# Patient Record
Sex: Male | Born: 1975 | Race: White | Hispanic: No | Smoking: Current every day smoker
Health system: Southern US, Community
[De-identification: ages and names within clinical notes are randomized; demographics above are authoritative.]

## PROBLEM LIST (undated history)

## (undated) HISTORY — PX: HERNIA REPAIR: SHX51

## (undated) HISTORY — PX: KNEE SURGERY: SHX244

## (undated) HISTORY — PX: BRAIN SURGERY: SHX531

---

## 2012-10-22 ENCOUNTER — Emergency Department (HOSPITAL_COMMUNITY): Payer: Self-pay

## 2012-10-22 ENCOUNTER — Encounter (HOSPITAL_COMMUNITY): Payer: Self-pay | Admitting: Emergency Medicine

## 2012-10-22 ENCOUNTER — Emergency Department (HOSPITAL_COMMUNITY)
Admission: EM | Admit: 2012-10-22 | Discharge: 2012-10-22 | Disposition: A | Discharge status: Home / Self Care | Payer: Self-pay | Attending: Emergency Medicine | Admitting: Emergency Medicine

## 2012-10-22 DIAGNOSIS — IMO0001 Reserved for inherently not codable concepts without codable children: Secondary | ICD-10-CM | POA: Insufficient documentation

## 2012-10-22 DIAGNOSIS — Z880 Allergy status to penicillin: Secondary | ICD-10-CM | POA: Insufficient documentation

## 2012-10-22 DIAGNOSIS — R112 Nausea with vomiting, unspecified: Secondary | ICD-10-CM | POA: Insufficient documentation

## 2012-10-22 DIAGNOSIS — Z8619 Personal history of other infectious and parasitic diseases: Secondary | ICD-10-CM | POA: Insufficient documentation

## 2012-10-22 DIAGNOSIS — J209 Acute bronchitis, unspecified: Secondary | ICD-10-CM | POA: Insufficient documentation

## 2012-10-22 DIAGNOSIS — R197 Diarrhea, unspecified: Secondary | ICD-10-CM | POA: Insufficient documentation

## 2012-10-22 DIAGNOSIS — R52 Pain, unspecified: Secondary | ICD-10-CM | POA: Insufficient documentation

## 2012-10-22 DIAGNOSIS — F17200 Nicotine dependence, unspecified, uncomplicated: Secondary | ICD-10-CM | POA: Insufficient documentation

## 2012-10-22 DIAGNOSIS — R1110 Vomiting, unspecified: Secondary | ICD-10-CM | POA: Insufficient documentation

## 2012-10-22 DIAGNOSIS — Z791 Long term (current) use of non-steroidal anti-inflammatories (NSAID): Secondary | ICD-10-CM | POA: Insufficient documentation

## 2012-10-22 MED ORDER — AZITHROMYCIN 250 MG PO TABS: ORAL_TABLET | ORAL | Status: DC

## 2012-10-22 NOTE — ED Notes (Signed)
Pt c/o generalized body aches, diarrhea, N/V, sore throat x 1 week.  Reports going to the Urgent Care on Thursday and was prescribed Hydrocodone for his cough and "some antibiotic that hasn't been helping".

## 2012-10-22 NOTE — ED Provider Notes (Signed)
CSN: [id number]     Arrival date & time 10/22/12  1904 History   This chart was scribed for Cameron Lyons, MD by Bennett Scrape, ED Scribe. This patient was seen in room APA09/APA09 and the patient's care was started at 9:13 PM.   Chief Complaint  Patient presents with  . Sore Throat  . Emesis  . Diarrhea  . Generalized Body Aches   The history is provided by the patient. No language interpreter was used.    HPI Comments: Cameron Kirk is a 37 y.o. male who presents to the Emergency Department complaining of ome week of persistent NP cough with associated rib pain with cough, emesis, diarrhea, diffuse HA, myalgias and sore throat. He was seen at an UC last week for the same and was prescribed hydrocodone cough medication and naprosyn. He states that he stopped taking the cough medication because it made him itch and "talk out of my head". Pt states that his step son at home had similar symptoms within the past 2 weeks diagnosed as a viral infection. He denies any fevers or urinary symptoms. Pt does not have a h/o chronic medical conditions and states that he has decreased his amount of daily smoking since the symptoms onset.    History reviewed. No pertinent past medical history. Past Surgical History  Procedure Laterality Date  . Knee surgery    . Hernia repair     No family history on file. History  Substance Use Topics  . Smoking status: Current Every Day Smoker -- 0.50 packs/day    Types: Cigarettes  . Smokeless tobacco: Not on file  . Alcohol Use: No    Review of Systems  Constitutional: Negative for fever and chills.  HENT: Positive for sore throat. Negative for trouble swallowing.   Respiratory: Positive for cough. Negative for shortness of breath.   Cardiovascular: Positive for chest pain (rib pain from coughing ).  Gastrointestinal: Positive for nausea, vomiting and diarrhea. Negative for abdominal pain.  Musculoskeletal: Positive for myalgias.  All other systems  reviewed and are negative.    Allergies  Penicillins and Tramadol  Home Medications   Current Outpatient Rx  Name  Route  Sig  Dispense  Refill  . HYDROcodone-homatropine (HYCODAN) 5-1.5 MG/5ML syrup   Oral   Take 5 mLs by mouth every 6 (six) hours as needed for cough.         . naproxen (NAPROSYN) 500 MG tablet   Oral   Take 500 mg by mouth daily.           Triage Vitals: BP 140/112  Pulse 85  Temp(Src) 98.4 F (36.9 C) (Oral)  Resp 20  Ht 5\' 7"  (1.702 m)  Wt 185 lb (83.915 kg)  BMI 28.97 kg/m2  SpO2 98%  Physical Exam  Nursing note and vitals reviewed. Constitutional: He is oriented to person, place, and time. He appears well-developed and well-nourished. No distress.  HENT:  Head: Normocephalic and atraumatic.  Mouth/Throat: Oropharynx is clear and moist.  Eyes: Conjunctivae and EOM are normal. Pupils are equal, round, and reactive to light.  Neck: Normal range of motion. Neck supple. No tracheal deviation present.  Cardiovascular: Normal rate, regular rhythm and normal heart sounds.   No murmur heard. Pulmonary/Chest: Effort normal and breath sounds normal. No respiratory distress. He has no wheezes. He has no rales.  Abdominal: Soft. Bowel sounds are normal. There is no tenderness.  Musculoskeletal: Normal range of motion. He exhibits no edema.  Neurological: He  is alert and oriented to person, place, and time. No cranial nerve deficit.  Skin: Skin is warm and dry.  Psychiatric: He has a normal mood and affect. His behavior is normal.    ED Course  Procedures (including critical care time)  DIAGNOSTIC STUDIES: Oxygen Saturation is 98% on room air, normal by my interpretation.    COORDINATION OF CARE: 9:17 PM-Advised pt that his symptoms could still be viral. Discussed treatment plan which includes CXR to rule out PNA with pt at bedside and pt agreed to plan.   10:00 PM-Informed pt of negative CXR. Discussed discharge plan which includes antibiotics  with pt and pt agreed to plan. Also advised pt to follow up as needed and to stop smoking and pt agreed. Addressed symptoms to return for with pt.   Labs Review Labs Reviewed - No data to display Imaging Review Dg Chest 2 View  10/22/2012   CLINICAL DATA:  Flu-like symptoms for 1 week. Smoker. Sore throat. Emesis. Diarrhea. Generalized body aches.  EXAM: CHEST  2 VIEW  COMPARISON:  None.  FINDINGS: Emphysematous changes in the lungs. Normal heart size and pulmonary vascularity. No focal airspace disease or consolidation in the lungs. No blunting of costophrenic angles. No pneumothorax. Mediastinal contours appear intact.  IMPRESSION: No active cardiopulmonary disease.   Electronically Signed   By: Burman Nieves M.D.   On: 10/22/2012 21:40    EKG Interpretation   None       MDM  No diagnosis found. Patient is a 37 year old male presents to the emergency department a one-week history of chest congestion productive cough and diarrhea. He states that he was seen in urgent care and was given a medication which is not helping. He denies any fevers or chills.   On exam vitals are stable the patient is afebrile. There is no hypoxia and no respiratory distress. His lungs are clear to auscultation and chest x-ray does not reveal an acute pneumonia. As the patient has been sick over one week and worsening and is now having productive sputum, I will prescribe azithromycin and continued over-the-counter cold remedies. If he develops chest pain or shortness of breath he is to return to the emergency department to be reevaluated.  I personally performed the services described in this documentation, which was scribed in my presence. The recorded information has been reviewed and is accurate.      Cameron Lyons, MD 10/22/12 2329

## 2012-10-22 NOTE — ED Notes (Signed)
Pt reporting sore throat, cough and general aches for about 1 week.  Denies fever or productive cough.

## 2012-11-15 ENCOUNTER — Encounter (HOSPITAL_COMMUNITY): Payer: Self-pay | Admitting: Emergency Medicine

## 2012-11-15 ENCOUNTER — Emergency Department (HOSPITAL_COMMUNITY)
Admission: EM | Admit: 2012-11-15 | Discharge: 2012-11-15 | Disposition: A | Discharge status: Home / Self Care | Payer: Self-pay | Attending: Emergency Medicine | Admitting: Emergency Medicine

## 2012-11-15 DIAGNOSIS — F17200 Nicotine dependence, unspecified, uncomplicated: Secondary | ICD-10-CM | POA: Insufficient documentation

## 2012-11-15 DIAGNOSIS — L02412 Cutaneous abscess of left axilla: Secondary | ICD-10-CM

## 2012-11-15 DIAGNOSIS — Z880 Allergy status to penicillin: Secondary | ICD-10-CM | POA: Insufficient documentation

## 2012-11-15 DIAGNOSIS — Z791 Long term (current) use of non-steroidal anti-inflammatories (NSAID): Secondary | ICD-10-CM | POA: Insufficient documentation

## 2012-11-15 DIAGNOSIS — IMO0002 Reserved for concepts with insufficient information to code with codable children: Secondary | ICD-10-CM | POA: Insufficient documentation

## 2012-11-15 MED ORDER — SULFAMETHOXAZOLE-TRIMETHOPRIM 800-160 MG PO TABS: 1.0000 | ORAL_TABLET | Freq: Two times a day (BID) | ORAL | Status: AC

## 2012-11-15 MED ORDER — HYDROCODONE-ACETAMINOPHEN 5-325 MG PO TABS: 1.0000 | ORAL_TABLET | ORAL | Status: DC | PRN

## 2012-11-15 MED ORDER — LIDOCAINE HCL 2 % EX GEL
CUTANEOUS | Status: AC
  Filled 2012-11-15: qty 30

## 2012-11-15 MED ORDER — LIDOCAINE HCL (PF) 1 % IJ SOLN
INTRAMUSCULAR | Status: AC
  Administered 2012-11-15: 5 mL
  Filled 2012-11-15: qty 5

## 2012-11-15 MED ORDER — LIDOCAINE HCL 2 % EX GEL
Freq: Once | CUTANEOUS | Status: AC
  Administered 2012-11-15: 1 via TOPICAL

## 2012-11-15 MED ORDER — LIDOCAINE HCL (PF) 1 % IJ SOLN
5.0000 mL | Freq: Once | INTRAMUSCULAR | Status: AC
  Administered 2012-11-15: 5 mL

## 2012-11-15 NOTE — ED Provider Notes (Signed)
CSN: [id number]     Arrival date & time 11/15/12  1002 History   First MD Initiated Contact with Patient 11/15/12 1027     Chief Complaint  Patient presents with  . Abscess   (Consider location/radiation/quality/duration/timing/severity/associated sxs/prior Treatment) Patient is a 37 y.o. male presenting with abscess. The history is provided by the patient.  Abscess Location:  Shoulder/arm Shoulder/arm abscess location:  L axilla Abscess quality: fluctuance, painful, redness and warmth   Red streaking: no   Duration:  3 days Progression:  Worsening Pain details:    Quality:  Burning and throbbing   Severity:  Moderate   Timing:  Constant   Progression:  Worsening Chronicity:  New Relieved by:  Nothing Worsened by:  Nothing tried Ineffective treatments:  NSAIDs Associated symptoms: no fever, no headaches, no nausea and no vomiting   Risk factors: no prior abscess    Cameron Kirk is a 37 y.o. male who presents to the ED with an abscess to the left axilla. The area started 3 days ago and is very tender. It has gotten much larger.   History reviewed. No pertinent past medical history. Past Surgical History  Procedure Laterality Date  . Knee surgery    . Hernia repair     No family history on file. History  Substance Use Topics  . Smoking status: Current Every Day Smoker -- 0.50 packs/day    Types: Cigarettes  . Smokeless tobacco: Not on file  . Alcohol Use: No    Review of Systems  Constitutional: Negative for fever and chills.  HENT: Negative.   Eyes: Negative for visual disturbance.  Cardiovascular: Negative for chest pain.  Gastrointestinal: Negative for nausea and vomiting.  Musculoskeletal:       Abscess left axilla   Skin: Positive for wound.  Allergic/Immunologic: Negative for immunocompromised state.  Neurological: Negative for light-headedness and headaches.  Psychiatric/Behavioral: The patient is not nervous/anxious.     Allergies  Penicillins  and Tramadol  Home Medications   Current Outpatient Rx  Name  Route  Sig  Dispense  Refill  . azithromycin (ZITHROMAX Z-PAK) 250 MG tablet      2 po day one, then 1 daily x 4 days   6 tablet   0   . HYDROcodone-homatropine (HYCODAN) 5-1.5 MG/5ML syrup   Oral   Take 5 mLs by mouth every 6 (six) hours as needed for cough.         . naproxen (NAPROSYN) 500 MG tablet   Oral   Take 500 mg by mouth daily.          BP 153/103  Pulse 112  Temp(Src) 98.1 F (36.7 C) (Oral)  Resp 16  Ht 5\' 7"  (1.702 m)  Wt 185 lb (83.915 kg)  BMI 28.97 kg/m2  SpO2 98% Physical Exam  Nursing note and vitals reviewed. Constitutional: He is oriented to person, place, and time. He appears well-developed and well-nourished.  HENT:  Head: Normocephalic and atraumatic.  Eyes: EOM are normal.  Neck: Neck supple.  Cardiovascular: Normal rate.   Pulmonary/Chest: Effort normal.  Musculoskeletal:  Right axilla with red, raised, tender fluctuant area. Tender on palpation.  Neurological: He is alert and oriented to person, place, and time. No cranial nerve deficit.  Skin: Skin is warm and dry.  Psychiatric: He has a normal mood and affect. His behavior is normal.    ED Course  Procedures INCISION AND DRAINAGE Performed by: Million Maharaj Consent: Verbal consent obtained. Risks and benefits: risks, benefits  and alternatives were discussed Type: abscess  Body area: left axilla  Area cleaned with betadine  Topical lidocaine gel applied   Anesthesia: local infiltration  Local anesthetic: lidocaine 1% without epinephrine  Anesthetic total: 2 ml  Incision made with # 11 blade  Complexity: complex Blunt dissection to break up loculations  Drainage: purulent  Drainage amount: moderate  Packing material: 1/4 in iodoform gauze  Patient tolerance: Patient tolerated the procedure well with no immediate complications.    MDM  37 y.o. male with abscess of left axilla drained without  difficulty. Patient stable for discharge without any immediate complications. He will return in 2 days for packing removal.  Discussed with the patient and all questioned fully answered.   Medication List    TAKE these medications       HYDROcodone-acetaminophen 5-325 MG per tablet  Commonly known as:  NORCO/VICODIN  Take 1 tablet by mouth every 4 (four) hours as needed.     sulfamethoxazole-trimethoprim 800-160 MG per tablet  Commonly known as:  BACTRIM DS,SEPTRA DS  Take 1 tablet by mouth 2 (two) times daily.      ASK your doctor about these medications       azithromycin 250 MG tablet  Commonly known as:  ZITHROMAX Z-PAK  2 po day one, then 1 daily x 4 days     HYDROcodone-homatropine 5-1.5 MG/5ML syrup  Commonly known as:  HYCODAN  Take 5 mLs by mouth every 6 (six) hours as needed for cough.     naproxen 500 MG tablet  Commonly known as:  NAPROSYN  Take 500 mg by mouth daily.            Janne Napoleon, NP 11/15/12 [phone]

## 2012-11-15 NOTE — ED Notes (Signed)
Abscess under left axilla x 3 days.  Denies drainage.

## 2012-11-16 NOTE — ED Provider Notes (Signed)
Medical screening examination/treatment/procedure(s) were performed by non-physician practitioner and as supervising physician I was immediately available for consultation/collaboration.  EKG Interpretation   None       Azha Constantin, MD, FACEP   Araiyah Cumpton L Pierson Vantol, MD 11/16/12 1449 

## 2013-06-17 ENCOUNTER — Encounter (HOSPITAL_COMMUNITY): Payer: Self-pay | Admitting: Emergency Medicine

## 2013-06-17 ENCOUNTER — Emergency Department (HOSPITAL_COMMUNITY)
Admission: EM | Admit: 2013-06-17 | Discharge: 2013-06-17 | Disposition: A | Discharge status: Home / Self Care | Payer: Self-pay | Attending: Emergency Medicine | Admitting: Emergency Medicine

## 2013-06-17 DIAGNOSIS — Z880 Allergy status to penicillin: Secondary | ICD-10-CM | POA: Insufficient documentation

## 2013-06-17 DIAGNOSIS — Z9889 Other specified postprocedural states: Secondary | ICD-10-CM | POA: Insufficient documentation

## 2013-06-17 DIAGNOSIS — F17200 Nicotine dependence, unspecified, uncomplicated: Secondary | ICD-10-CM | POA: Insufficient documentation

## 2013-06-17 DIAGNOSIS — L02215 Cutaneous abscess of perineum: Secondary | ICD-10-CM

## 2013-06-17 DIAGNOSIS — Z792 Long term (current) use of antibiotics: Secondary | ICD-10-CM | POA: Insufficient documentation

## 2013-06-17 DIAGNOSIS — Z79899 Other long term (current) drug therapy: Secondary | ICD-10-CM | POA: Insufficient documentation

## 2013-06-17 DIAGNOSIS — Z791 Long term (current) use of non-steroidal anti-inflammatories (NSAID): Secondary | ICD-10-CM | POA: Insufficient documentation

## 2013-06-17 DIAGNOSIS — K612 Anorectal abscess: Secondary | ICD-10-CM | POA: Insufficient documentation

## 2013-06-17 MED ORDER — SULFAMETHOXAZOLE-TMP DS 800-160 MG PO TABS
1.0000 | ORAL_TABLET | Freq: Once | ORAL | Status: AC
  Administered 2013-06-17: 1 via ORAL
  Filled 2013-06-17: qty 1

## 2013-06-17 MED ORDER — SULFAMETHOXAZOLE-TRIMETHOPRIM 800-160 MG PO TABS: 1.0000 | ORAL_TABLET | Freq: Two times a day (BID) | ORAL | Status: DC

## 2013-06-17 MED ORDER — LIDOCAINE HCL (PF) 1 % IJ SOLN
INTRAMUSCULAR | Status: AC
  Filled 2013-06-17: qty 5

## 2013-06-17 MED ORDER — OXYCODONE-ACETAMINOPHEN 5-325 MG PO TABS: 1.0000 | ORAL_TABLET | ORAL | Status: DC | PRN

## 2013-06-17 MED ORDER — OXYCODONE-ACETAMINOPHEN 5-325 MG PO TABS
1.0000 | ORAL_TABLET | Freq: Once | ORAL | Status: AC
  Administered 2013-06-17: 1 via ORAL
  Filled 2013-06-17: qty 1

## 2013-06-17 NOTE — Discharge Instructions (Signed)
Abscess An abscess is an infected area that contains a collection of pus and debris.It can occur in almost any part of the body. An abscess is also known as a furuncle or boil. CAUSES  An abscess occurs when tissue gets infected. This can occur from blockage of oil or sweat glands, infection of hair follicles, or a minor injury to the skin. As the body tries to fight the infection, pus collects in the area and creates pressure under the skin. This pressure causes pain. People with weakened immune systems have difficulty fighting infections and get certain abscesses more often.  SYMPTOMS Usually an abscess develops on the skin and becomes a painful mass that is red, warm, and tender. If the abscess forms under the skin, you may feel a moveable soft area under the skin. Some abscesses break open (rupture) on their own, but most will continue to get worse without care. The infection can spread deeper into the body and eventually into the bloodstream, causing you to feel ill.  DIAGNOSIS  Your caregiver will take your medical history and perform a physical exam. A sample of fluid may also be taken from the abscess to determine what is causing your infection. TREATMENT  Your caregiver may prescribe antibiotic medicines to fight the infection. However, taking antibiotics alone usually does not cure an abscess. Your caregiver may need to make a small cut (incision) in the abscess to drain the pus. In some cases, gauze is packed into the abscess to reduce pain and to continue draining the area. HOME CARE INSTRUCTIONS   Only take over-the-counter or prescription medicines for pain, discomfort, or fever as directed by your caregiver.  If you were prescribed antibiotics, take them as directed. Finish them even if you start to feel better.  If gauze is used, follow your caregiver's directions for changing the gauze.  To avoid spreading the infection:  Keep your draining abscess covered with a  bandage.  Wash your hands well.  Do not share personal care items, towels, or whirlpools with others.  Avoid skin contact with others.  Keep your skin and clothes clean around the abscess.  Keep all follow-up appointments as directed by your caregiver. SEEK MEDICAL CARE IF:   You have increased pain, swelling, redness, fluid drainage, or bleeding.  You have muscle aches, chills, or a general ill feeling.  You have a fever. MAKE SURE YOU:   Understand these instructions.  Will watch your condition.  Will get help right away if you are not doing well or get worse. Document Released: 09/29/2004 Document Revised: 06/21/2011 Document Reviewed: 03/04/2011 [facility] Patient Information 2014 [employer] [place], [place].     Take your next dose of antibiotics tomorrow morning.  Continue with  warm soaks 3-4 times daily until this is healed.  Use the pain medication as needed, use caution as this will make you drowsy return here if your symptoms are not proved been over the next several or if you have or pain or increased swelling.

## 2013-06-17 NOTE — ED Notes (Signed)
J Idol, PA in with pt

## 2013-06-17 NOTE — ED Notes (Signed)
Patient states he has an abscess to his buttocks.  Patient states pain is getting worse.

## 2013-06-18 NOTE — ED Provider Notes (Signed)
Medical screening examination/treatment/procedure(s) were performed by non-physician practitioner and as supervising physician I was immediately available for consultation/collaboration.   EKG Interpretation None        Kathleen M McManus, DO 06/18/13 1610 

## 2013-06-18 NOTE — ED Provider Notes (Signed)
CSN: [id number]     Arrival date & time 06/17/13  2048 History   First MD Initiated Contact with Patient 06/17/13 2140     Chief Complaint  Patient presents with  . Abscess     (Consider location/radiation/quality/duration/timing/severity/associated sxs/prior Treatment) Patient is a 38 y.o. male presenting with abscess. The history is provided by the patient and the spouse.  Abscess Location:  Ano-genital Ano-genital abscess location:  Perineum Size:  2 cm Abscess quality: painful, redness and weeping   Red streaking: no   Duration:  4 days Progression:  Unchanged Pain details:    Quality:  Sharp   Severity:  Severe   Timing:  Constant Chronicity:  New (He reports history of left axillary abscess. Todays location is new) Context: not diabetes and not skin injury   Relieved by:  Nothing Worsened by:  Draining/squeezing Ineffective treatments:  Warm compresses Associated symptoms: no fever, no nausea and no vomiting     History reviewed. No pertinent past medical history. Past Surgical History  Procedure Laterality Date  . Knee surgery    . Hernia repair     No family history on file. History  Substance Use Topics  . Smoking status: Current Every Day Smoker -- 0.50 packs/day    Types: Cigarettes  . Smokeless tobacco: Not on file  . Alcohol Use: No    Review of Systems  Constitutional: Negative for fever and chills.  Respiratory: Negative for shortness of breath and wheezing.   Gastrointestinal: Negative for nausea and vomiting.  Skin: Positive for wound.  Neurological: Negative for numbness.      Allergies  Penicillins and Tramadol  Home Medications   Prior to Admission medications   Medication Sig Start Date End Date Taking? Authorizing Provider  azithromycin (ZITHROMAX Z-PAK) 250 MG tablet 2 po day one, then 1 daily x 4 days 10/22/12   Geoffery Lyonsouglas Delo, MD  HYDROcodone-acetaminophen (NORCO/VICODIN) 5-325 MG per tablet Take 1 tablet by mouth every 4 (four)  hours as needed. 11/15/12   Hope Orlene OchM Neese, NP  HYDROcodone-homatropine (HYCODAN) 5-1.5 MG/5ML syrup Take 5 mLs by mouth every 6 (six) hours as needed for cough.    Historical Provider, MD  naproxen (NAPROSYN) 500 MG tablet Take 500 mg by mouth daily.    Historical Provider, MD  oxyCODONE-acetaminophen (PERCOCET/ROXICET) 5-325 MG per tablet Take 1 tablet by mouth every 4 (four) hours as needed for severe pain. 06/17/13   Burgess AmorJulie Idol, PA-C  sulfamethoxazole-trimethoprim (SEPTRA DS) 800-160 MG per tablet Take 1 tablet by mouth every 12 (twelve) hours. 06/17/13   Burgess AmorJulie Idol, PA-C   BP 155/107  Pulse 100  Temp(Src) 97.8 F (36.6 C) (Oral)  Resp 20  Ht 5\' 7"  (1.702 m)  Wt 180 lb (81.647 kg)  BMI 28.19 kg/m2  SpO2 98% Physical Exam  Constitutional: He appears well-developed and well-nourished. No distress.  HENT:  Head: Normocephalic.  Neck: Neck supple.  Cardiovascular: Normal rate.   Pulmonary/Chest: Effort normal. He has no wheezes.  Musculoskeletal: Normal range of motion. He exhibits no edema.  Skin:  2 cm indurated and raised abscess right buttock/perineal space.  No spontaneous drainage, but there are 2 small purulent appearing ulceration.  No rectal involvement    Chaperone was present during exam.   ED Course  Procedures (including critical care time)  INCISION AND DRAINAGE Performed by: Burgess AmorIDOL, JULIE Consent: Verbal consent obtained. Risks and benefits: risks, benefits and alternatives were discussed Type: abscess  Body area: perineum Anesthesia: local infiltration  Incision  was made with a scalpel.  Local anesthetic: lidocaine 1% without epinephrine  Anesthetic total: 2 ml  Complexity: complex Blunt dissection to break up loculations  Drainage: purulent  Drainage amount: scant.  Obtained more blood than purulent drainage  Packing material: no packing Patient tolerance: Patient tolerated the procedure well with no immediate complications.    Labs Review Labs  Reviewed - No data to display  Imaging Review No results found.   EKG Interpretation None      MDM   Final diagnoses:  Abscess of perineum   Encouraged continued warm water soaks.  Prescribed oxycodone, bactrim.  Return here for a recheck if this site does not start improving in size and pain over the next 2 days.  Pt and wife understand and agree with plan.     Burgess AmorJulie Idol, PA-C 06/18/13 1436

## 2014-01-30 ENCOUNTER — Emergency Department (HOSPITAL_COMMUNITY)
Admission: EM | Admit: 2014-01-30 | Discharge: 2014-01-30 | Disposition: A | Discharge status: Home / Self Care | Payer: Self-pay | Attending: Emergency Medicine | Admitting: Emergency Medicine

## 2014-01-30 ENCOUNTER — Encounter (HOSPITAL_COMMUNITY): Payer: Self-pay | Admitting: Emergency Medicine

## 2014-01-30 DIAGNOSIS — L02811 Cutaneous abscess of head [any part, except face]: Secondary | ICD-10-CM | POA: Insufficient documentation

## 2014-01-30 DIAGNOSIS — F17210 Nicotine dependence, cigarettes, uncomplicated: Secondary | ICD-10-CM | POA: Insufficient documentation

## 2014-01-30 MED ORDER — IBUPROFEN 800 MG PO TABS: 800.0000 mg | ORAL_TABLET | Freq: Three times a day (TID) | ORAL | Status: DC

## 2014-01-30 MED ORDER — HYDROCODONE-ACETAMINOPHEN 5-325 MG PO TABS: 1.0000 | ORAL_TABLET | ORAL | Status: DC | PRN

## 2014-01-30 MED ORDER — IBUPROFEN 800 MG PO TABS
800.0000 mg | ORAL_TABLET | Freq: Once | ORAL | Status: AC
  Administered 2014-01-30: 800 mg via ORAL
  Filled 2014-01-30: qty 1

## 2014-01-30 MED ORDER — HYDROCODONE-ACETAMINOPHEN 5-325 MG PO TABS
2.0000 | ORAL_TABLET | Freq: Once | ORAL | Status: AC
  Administered 2014-01-30: 2 via ORAL
  Filled 2014-01-30: qty 2

## 2014-01-30 MED ORDER — DOXYCYCLINE HYCLATE 100 MG PO TABS
100.0000 mg | ORAL_TABLET | Freq: Once | ORAL | Status: AC
  Administered 2014-01-30: 100 mg via ORAL
  Filled 2014-01-30: qty 1

## 2014-01-30 MED ORDER — DOXYCYCLINE HYCLATE 100 MG PO CAPS: 100.0000 mg | ORAL_CAPSULE | Freq: Two times a day (BID) | ORAL | Status: DC

## 2014-01-30 NOTE — ED Provider Notes (Signed)
CSN: [id number]     Arrival date & time 01/30/14  1755 History   First MD Initiated Contact with Patient 01/30/14 1830     Chief Complaint  Patient presents with  . Insect Bite     (Consider location/radiation/quality/duration/timing/severity/associated sxs/prior Treatment) HPI Comments: Patient presents to the emergency department with a complaint of "an infected bump".  Patient states that he recently had a haircut that was extremely close on the back of his neck and extending into the back of his head. He states that a "bump" came up. He states the bump became very irritating, so he squeezed it and tried to pop it. He states that some blood and mild amount of pus came out. After this he began to have pain in the area. Yesterday and today he has noticed increasing redness present. He has pain when he puts on his glasses, and pain if he touches the area around the infected bump area. He has not had any high fever. He has not had any nausea or vomiting. He denies any immune compromising conditions. Nothing makes the pain any better, palpation, and his glasses make the pain worse.  The history is provided by the patient.    History reviewed. No pertinent past medical history. Past Surgical History  Procedure Laterality Date  . Knee surgery    . Hernia repair     History reviewed. No pertinent family history. History  Substance Use Topics  . Smoking status: Current Every Day Smoker -- 0.50 packs/day    Types: Cigarettes  . Smokeless tobacco: Not on file  . Alcohol Use: No    Review of Systems  Constitutional: Negative for activity change.       All ROS Neg except as noted in HPI  HENT: Negative.  Negative for nosebleeds.   Eyes: Negative for photophobia and discharge.  Respiratory: Negative for cough, shortness of breath and wheezing.   Cardiovascular: Negative for chest pain and palpitations.  Gastrointestinal: Negative for abdominal pain and blood in stool.  Genitourinary:  Negative for dysuria, frequency and hematuria.  Musculoskeletal: Negative for back pain, arthralgias and neck pain.  Skin: Negative.   Neurological: Negative for dizziness, seizures and speech difficulty.  Psychiatric/Behavioral: Negative for hallucinations and confusion.      Allergies  Penicillins and Tramadol  Home Medications   Prior to Admission medications   Medication Sig Start Date End Date Taking? Authorizing Provider  azithromycin (ZITHROMAX Z-PAK) 250 MG tablet 2 po day one, then 1 daily x 4 days 10/22/12   [name], MD  HYDROcodone-acetaminophen (NORCO/VICODIN) 5-325 MG per tablet Take 1 tablet by mouth every 4 (four) hours as needed. 11/15/12   Hope Orlene Och, NP  HYDROcodone-homatropine (HYCODAN) 5-1.5 MG/5ML syrup Take 5 mLs by mouth every 6 (six) hours as needed for cough.    Historical Provider, MD  naproxen (NAPROSYN) 500 MG tablet Take 500 mg by mouth daily.    Historical Provider, MD  oxyCODONE-acetaminophen (PERCOCET/ROXICET) 5-325 MG per tablet Take 1 tablet by mouth every 4 (four) hours as needed for severe pain. 06/17/13   Burgess Amor, PA-C  sulfamethoxazole-trimethoprim (SEPTRA DS) 800-160 MG per tablet Take 1 tablet by mouth every 12 (twelve) hours. 06/17/13   Burgess Amor, PA-C   BP 148/112 mmHg  Pulse 115  Temp(Src) 97.8 F (36.6 C) (Oral)  Resp 20  Ht  (1.702 m)  Wt 185 lb (83.915 kg)  BMI 28.97 kg/m2  SpO2 98% Physical Exam  Constitutional: He is oriented  to person, place, and time. He appears well-developed and well-nourished.  Non-toxic appearance.  HENT:  Head: Normocephalic.  Right Ear: Tympanic membrane and external ear normal.  Left Ear: Tympanic membrane and external ear normal.  nickle size abscess with scab to the right side of the scalp. Mild to mod increase redness of the right posterior scalp and extending to the posterior neck.  Eyes: EOM and lids are normal. Pupils are equal, round, and reactive to light.  Neck: Normal range of  motion. Neck supple. Carotid bruit is not present.  Cardiovascular: Regular rhythm, normal heart sounds, intact distal pulses and normal pulses.  Tachycardia present.   Pulmonary/Chest: Breath sounds normal. No respiratory distress.  Abdominal: Soft. Bowel sounds are normal. There is no tenderness. There is no guarding.  Musculoskeletal: Normal range of motion.  Lymphadenopathy:       Head (right side): No submandibular adenopathy present.       Head (left side): No submandibular adenopathy present.    He has cervical adenopathy.  Neurological: He is alert and oriented to person, place, and time. He has normal strength. No cranial nerve deficit or sensory deficit.  Skin: Skin is warm and dry.  Psychiatric: He has a normal mood and affect. His speech is normal.  Nursing note and vitals reviewed.   ED Course  Procedures (including critical care time) Labs Review Labs Reviewed - No data to display  Imaging Review No results found.   EKG Interpretation None      MDM  Vital signs reveal pulse rate of 115, 103 during my exam. Blood pressure elevated.  Pt give hx of "white -coat" syndrome.   Small abscess of the posterior scalp. Pt to be treated with doxycycline, norco and ibuprofen. He will return if any changes or problem.   Final diagnoses:  None    *I have reviewed nursing notes, vital signs, and all appropriate lab and imaging results for this patient.[address]    Kenijah Benningfield M Tyronda Vizcarrondo, PA-C 01/31/14 [id number]  Flint MelterElliott L Wentz, MD 02/01/14 [phone]

## 2014-01-30 NOTE — ED Notes (Signed)
Patient has closed and scabbed over wound on right side of head x 1 week. States only drainage from area is bloody fluid.

## 2014-01-30 NOTE — Discharge Instructions (Signed)
Please apply warm compresses to the area of the right scalp 3 times daily. Please apply Neosporin 3 times daily. Please use doxycycline 2 times daily with food. Please use ibuprofen 3 times daily with food for inflammation. May use Norco for pain if needed. Norco may cause drowsiness, please use with caution. Please see your physician or return to the emergency department if any red streaks from the abscess area, high fever, nausea vomiting, or deterioration in your general condition. Abscess An abscess (boil or furuncle) is an infected area on or under the skin. This area is filled with yellowish-white fluid (pus) and other material (debris). HOME CARE   Only take medicines as told by your doctor.  If you were given antibiotic medicine, take it as directed. Finish the medicine even if you start to feel better.  If gauze is used, follow your doctor's directions for changing the gauze.  To avoid spreading the infection:  Keep your abscess covered with a bandage.  Wash your hands well.  Do not share personal care items, towels, or whirlpools with others.  Avoid skin contact with others.  Keep your skin and clothes clean around the abscess.  Keep all doctor visits as told. GET HELP RIGHT AWAY IF:   You have more pain, puffiness (swelling), or redness in the wound site.  You have more fluid or blood coming from the wound site.  You have muscle aches, chills, or you feel sick.  You have a fever. MAKE SURE YOU:   Understand these instructions.  Will watch your condition.  Will get help right away if you are not doing well or get worse. Document Released: 06/08/2007 Document Revised: 06/21/2011 Document Reviewed: 03/04/2011 [facility] Patient Information 2015 [employer], [place]. This information is not intended to replace advice given to you by your health care provider. Make sure you discuss any questions you have with your health care provider.

## 2014-01-30 NOTE — ED Notes (Signed)
Notice a bump to right side of head.  Not sure if it was a insect bite or not.  Pt tried to bust bump with fingers and got blood back on today.  Rates pain 8.

## 2014-03-17 ENCOUNTER — Telehealth: Payer: Self-pay | Admitting: Family Medicine

## 2014-03-17 NOTE — Telephone Encounter (Signed)
Requesting appointment to establish care and address hypertension. I spoke with his emergency contact. Patient was in the room with her.  He has no diagnosed medical conditions and isn't taking any medications.  He has noted an elevated blood pressure. Most recent reading was this morning and was 165/115.  He will keep a log to bring to his appointment. Advised to seek emergency care if he develops a sudden or severe headache or any other concerning symptoms.  He can also call to see if we have any cancellations.  Emergency contact stated understanding and agreement to plan.

## 2014-04-16 ENCOUNTER — Telehealth: Payer: Self-pay | Admitting: Family

## 2014-04-16 ENCOUNTER — Ambulatory Visit: Payer: Self-pay | Admitting: Family

## 2014-04-16 NOTE — Telephone Encounter (Signed)
Patient notified no sooner new patient appts

## 2014-05-26 ENCOUNTER — Ambulatory Visit: Payer: Self-pay | Admitting: Family

## 2014-07-08 ENCOUNTER — Emergency Department (HOSPITAL_COMMUNITY)
Admission: EM | Admit: 2014-07-08 | Discharge: 2014-07-08 | Disposition: A | Discharge status: Home / Self Care | Payer: Self-pay | Attending: Emergency Medicine | Admitting: Emergency Medicine

## 2014-07-08 ENCOUNTER — Encounter (HOSPITAL_COMMUNITY): Payer: Self-pay

## 2014-07-08 ENCOUNTER — Emergency Department (HOSPITAL_COMMUNITY): Payer: Self-pay

## 2014-07-08 DIAGNOSIS — W228XXA Striking against or struck by other objects, initial encounter: Secondary | ICD-10-CM | POA: Insufficient documentation

## 2014-07-08 DIAGNOSIS — Z720 Tobacco use: Secondary | ICD-10-CM | POA: Insufficient documentation

## 2014-07-08 DIAGNOSIS — Y9289 Other specified places as the place of occurrence of the external cause: Secondary | ICD-10-CM | POA: Insufficient documentation

## 2014-07-08 DIAGNOSIS — Y998 Other external cause status: Secondary | ICD-10-CM | POA: Insufficient documentation

## 2014-07-08 DIAGNOSIS — IMO0001 Reserved for inherently not codable concepts without codable children: Secondary | ICD-10-CM

## 2014-07-08 DIAGNOSIS — Z880 Allergy status to penicillin: Secondary | ICD-10-CM | POA: Insufficient documentation

## 2014-07-08 DIAGNOSIS — R030 Elevated blood-pressure reading, without diagnosis of hypertension: Secondary | ICD-10-CM | POA: Insufficient documentation

## 2014-07-08 DIAGNOSIS — Y9389 Activity, other specified: Secondary | ICD-10-CM | POA: Insufficient documentation

## 2014-07-08 DIAGNOSIS — L03116 Cellulitis of left lower limb: Secondary | ICD-10-CM | POA: Insufficient documentation

## 2014-07-08 LAB — CBC WITH DIFFERENTIAL/PLATELET
BASOS ABS: 0 10*3/uL (ref 0.0–0.1)
Basophils Relative: 0 % (ref 0–1)
Eosinophils Absolute: 0.3 10*3/uL (ref 0.0–0.7)
Eosinophils Relative: 2 % (ref 0–5)
HCT: 46.8 % (ref 39.0–52.0)
Hemoglobin: 16.2 g/dL (ref 13.0–17.0)
LYMPHS PCT: 21 % (ref 12–46)
Lymphs Abs: 2.1 10*3/uL (ref 0.7–4.0)
MCH: 31.1 pg (ref 26.0–34.0)
MCHC: 34.6 g/dL (ref 30.0–36.0)
MCV: 89.8 fL (ref 78.0–100.0)
Monocytes Absolute: 0.6 10*3/uL (ref 0.1–1.0)
Monocytes Relative: 6 % (ref 3–12)
NEUTROS ABS: 7.3 10*3/uL (ref 1.7–7.7)
Neutrophils Relative %: 71 % (ref 43–77)
PLATELETS: 209 10*3/uL (ref 150–400)
RBC: 5.21 MIL/uL (ref 4.22–5.81)
RDW: 12.4 % (ref 11.5–15.5)
WBC: 10.3 10*3/uL (ref 4.0–10.5)

## 2014-07-08 LAB — BASIC METABOLIC PANEL
ANION GAP: 8 (ref 5–15)
BUN: 9 mg/dL (ref 6–20)
CHLORIDE: 103 mmol/L (ref 101–111)
CO2: 27 mmol/L (ref 22–32)
Calcium: 8.7 mg/dL — ABNORMAL LOW (ref 8.9–10.3)
Creatinine, Ser: 0.91 mg/dL (ref 0.61–1.24)
GFR calc Af Amer: >60 mL/min (ref >60)
GFR calc non Af Amer: >60 mL/min (ref >60)
Glucose, Bld: 100 mg/dL — ABNORMAL HIGH (ref 65–99)
Potassium: 3.9 mmol/L (ref 3.5–5.1)
SODIUM: 138 mmol/L (ref 135–145)

## 2014-07-08 LAB — CBG MONITORING, ED: GLUCOSE-CAPILLARY: 81 mg/dL (ref 65–99)

## 2014-07-08 MED ORDER — CEPHALEXIN 500 MG PO CAPS: 500.0000 mg | ORAL_CAPSULE | Freq: Four times a day (QID) | ORAL | Status: AC

## 2014-07-08 MED ORDER — DOXYCYCLINE HYCLATE 100 MG PO CAPS: 100.0000 mg | ORAL_CAPSULE | Freq: Two times a day (BID) | ORAL | Status: AC

## 2014-07-08 MED ORDER — HYDROCODONE-ACETAMINOPHEN 5-325 MG PO TABS: 1.0000 | ORAL_TABLET | ORAL | Status: DC | PRN

## 2014-07-08 MED ORDER — VANCOMYCIN HCL IN DEXTROSE 1-5 GM/200ML-% IV SOLN
1000.0000 mg | Freq: Once | INTRAVENOUS | Status: AC
  Administered 2014-07-08: 1000 mg via INTRAVENOUS
  Filled 2014-07-08: qty 200

## 2014-07-08 NOTE — Discharge Instructions (Signed)

## 2014-07-08 NOTE — ED Notes (Signed)
Patient verbalizes understanding of discharge instructions, prescription medications, home care and follow up care. Patient out of department at this time with family using crutches.

## 2014-07-08 NOTE — ED Notes (Signed)
C/o left foot injury. Patient states he "hit it on a block" patients left foot is swollen and red, open area noted between second and third digit. Patient denies drainage.

## 2014-07-08 NOTE — ED Notes (Signed)
EDP notified of BP 

## 2014-07-09 NOTE — ED Provider Notes (Signed)
CSN: [id number]     Arrival date & time 07/08/14  1946 History   First MD Initiated Contact with Patient 07/08/14 2040     Chief Complaint  Patient presents with  . Foot Injury     (Consider location/radiation/quality/duration/timing/severity/associated sxs/prior Treatment) The history is provided by the patient.   Cameron Kirk is a 39 y.o. male with no significant past medical history presenting with a 5 day history of worsening swelling, redness and pain to his left toes and foot.  He describes stubbing his foot (while wearing shoes) against a cement block after which time he developed pain, swelling, now redness which has spread to his entire left foot.  He denies fevers or chills, nausea, vomiting, no radiation of pain proximal to the foot. He has used elevation and has been using crutches to avoid weight bearing which makes the pain worse.      History reviewed. No pertinent past medical history. Past Surgical History  Procedure Laterality Date  . Knee surgery    . Hernia repair     History reviewed. No pertinent family history. History  Substance Use Topics  . Smoking status: Current Every Day Smoker -- 0.50 packs/day    Types: Cigarettes  . Smokeless tobacco: Not on file  . Alcohol Use: No    Review of Systems  Constitutional: Negative for fever and chills.  Musculoskeletal: Positive for arthralgias. Negative for myalgias.  Skin: Positive for color change.  Neurological: Negative for weakness and numbness.      Allergies  Ibuprofen; Penicillins; and Tramadol  Home Medications   Prior to Admission medications   Medication Sig Start Date End Date Taking? Authorizing Provider  aspirin-acetaminophen-caffeine (EXCEDRIN MIGRAINE) [phone] MG per tablet Take 1-2 tablets by mouth daily as needed for headache.   Yes Historical Provider, MD  cephALEXin (KEFLEX) 500 MG capsule Take 1 capsule (500 mg total) by mouth 4 (four) times daily. 07/08/14   Burgess Amor, PA-C   doxycycline (VIBRAMYCIN) 100 MG capsule Take 1 capsule (100 mg total) by mouth 2 (two) times daily. 07/08/14   Burgess Amor, PA-C  HYDROcodone-acetaminophen (NORCO/VICODIN) 5-325 MG per tablet Take 1 tablet by mouth every 4 (four) hours as needed. 07/08/14   Burgess Amor, PA-C   BP 151/103 mmHg  Pulse 81  Temp(Src) 98.3 F (36.8 C) (Oral)  Resp 18  Ht  (1.702 m)  Wt 175 lb (79.379 kg)  BMI 27.40 kg/m2  SpO2 99% Physical Exam  Constitutional: He appears well-developed and well-nourished. No distress.  HENT:  Head: Normocephalic.  Neck: Neck supple.  Cardiovascular: Normal rate.   Pulses:      Dorsalis pedis pulses are 2+ on the right side, and 2+ on the left side.  Pulmonary/Chest: Effort normal. He has no wheezes.  Musculoskeletal: Normal range of motion. He exhibits edema.  Pt with moderate edema to his left distal 2nd, third and 4th toes with bruising and redness at the base and between the 2nd and 3rd toes.  There is light pink discoloration and edema of the lateral foot to the lateral malleolus.  No red streaking.  No lacerations.    Skin: There is erythema.    ED Course  Procedures (including critical care time) Labs Review Labs Reviewed  BASIC METABOLIC PANEL - Abnormal; Notable for the following:    Glucose, Bld 100 (*)    Calcium 8.7 (*)    All other components within normal limits  CBC WITH DIFFERENTIAL/PLATELET  CBG MONITORING, ED  Imaging Review Dg Foot Complete Left  07/08/2014   CLINICAL DATA:  [age] year old male with left foot pain and swelling  EXAM: LEFT FOOT - COMPLETE 3+ VIEW  COMPARISON:  None.  FINDINGS: There is no evidence of fracture or dislocation. There is no evidence of arthropathy or other focal bone abnormality. There is diffuse soft tissue swelling of the foot. No radiopaque foreign object identified. An 8 mm calcaneal spur noted.  IMPRESSION: Diffuse soft tissue swelling.  No fracture or dislocation.   Electronically Signed   By: Elgie CollardArash  Radparvar  M.D.   On: 07/08/2014 20:51     EKG Interpretation None      MDM   Final diagnoses:  Cellulitis of foot, left  Elevated blood pressure    Patients labs and/or radiological studies were reviewed and considered during the medical decision making and disposition process.  Results were also discussed with patient.  Pt was given vancomycin 1 gram IV while in ed.  No advance of the cellulitis while here.  Advised elevation, warm compresses. Prescribed doxycycline and keflex, hydrocodone for pain.  Advised recheck in 2 days here if not improvement, sooner for any worsened sx.   Pt was seen by Dr. Adriana Simasook during this visit.  Discussed elevated bp.  He was given referral to establish pcp for further management/ recheck of bp.    Burgess AmorJulie Harlea Goetzinger, PA-C 07/09/14 0140  Donnetta HutchingBrian Cook, MD 07/09/14 [phone]

## 2014-07-21 ENCOUNTER — Encounter: Payer: Self-pay | Admitting: *Deleted

## 2014-08-21 ENCOUNTER — Ambulatory Visit: Payer: Self-pay | Admitting: Family

## 2014-08-22 ENCOUNTER — Encounter: Payer: Self-pay | Admitting: Family

## 2015-04-19 ENCOUNTER — Emergency Department (HOSPITAL_COMMUNITY)
Admission: EM | Admit: 2015-04-19 | Discharge: 2015-04-19 | Disposition: A | Discharge status: Home / Self Care | Payer: Self-pay | Attending: Emergency Medicine | Admitting: Emergency Medicine

## 2015-04-19 ENCOUNTER — Emergency Department (HOSPITAL_COMMUNITY): Payer: Self-pay

## 2015-04-19 ENCOUNTER — Encounter (HOSPITAL_COMMUNITY): Payer: Self-pay | Admitting: Emergency Medicine

## 2015-04-19 DIAGNOSIS — Y9389 Activity, other specified: Secondary | ICD-10-CM | POA: Insufficient documentation

## 2015-04-19 DIAGNOSIS — M79641 Pain in right hand: Secondary | ICD-10-CM | POA: Insufficient documentation

## 2015-04-19 DIAGNOSIS — Z7982 Long term (current) use of aspirin: Secondary | ICD-10-CM | POA: Insufficient documentation

## 2015-04-19 DIAGNOSIS — Y990 Civilian activity done for income or pay: Secondary | ICD-10-CM | POA: Insufficient documentation

## 2015-04-19 DIAGNOSIS — Z79891 Long term (current) use of opiate analgesic: Secondary | ICD-10-CM | POA: Insufficient documentation

## 2015-04-19 DIAGNOSIS — Z792 Long term (current) use of antibiotics: Secondary | ICD-10-CM | POA: Insufficient documentation

## 2015-04-19 DIAGNOSIS — F17210 Nicotine dependence, cigarettes, uncomplicated: Secondary | ICD-10-CM | POA: Insufficient documentation

## 2015-04-19 DIAGNOSIS — Y929 Unspecified place or not applicable: Secondary | ICD-10-CM | POA: Insufficient documentation

## 2015-04-19 DIAGNOSIS — W271XXA Contact with garden tool, initial encounter: Secondary | ICD-10-CM | POA: Insufficient documentation

## 2015-04-19 MED ORDER — HYDROCODONE-ACETAMINOPHEN 5-325 MG PO TABS: 2.0000 | ORAL_TABLET | ORAL | Status: DC | PRN

## 2015-04-19 MED ORDER — HYDROCODONE-ACETAMINOPHEN 5-325 MG PO TABS
2.0000 | ORAL_TABLET | Freq: Once | ORAL | Status: AC
  Administered 2015-04-19: 2 via ORAL
  Filled 2015-04-19: qty 2

## 2015-04-19 NOTE — ED Provider Notes (Signed)
CSN: [id number]     Arrival date & time 04/19/15  1640 History   First MD Initiated Contact with Patient 04/19/15 1651     Chief Complaint  Patient presents with  . Hand Injury     (Consider location/radiation/quality/duration/timing/severity/associated sxs/prior Treatment) Patient is a 40 y.o. male presenting with hand pain.  Hand Pain This is a new problem. The current episode started yesterday. The problem occurs constantly. The problem has not changed since onset.Pertinent negatives include no chest pain, no abdominal pain, no headaches and no shortness of breath. Exacerbated by: movement. Nothing relieves the symptoms.    History reviewed. No pertinent past medical history. Past Surgical History  Procedure Laterality Date  . Knee surgery    . Hernia repair     History reviewed. No pertinent family history. Social History  Substance Use Topics  . Smoking status: Current Every Day Smoker -- 0.50 packs/day    Types: Cigarettes  . Smokeless tobacco: None  . Alcohol Use: No    Review of Systems  Constitutional: Negative for fever, chills and activity change.  HENT: Negative for congestion and rhinorrhea.   Eyes: Negative for visual disturbance.  Respiratory: Negative for cough and shortness of breath.   Cardiovascular: Negative for chest pain.  Gastrointestinal: Negative for vomiting, abdominal pain, diarrhea and constipation.  Endocrine: Negative for polyuria.  Genitourinary: Negative for dysuria and flank pain.  Musculoskeletal: Negative for back pain and neck pain.       Right hand pain  Skin: Negative for wound.  Neurological: Negative for headaches.      Allergies  Ibuprofen; Penicillins; and Tramadol  Home Medications   Prior to Admission medications   Medication Sig Start Date End Date Taking? Authorizing Provider  aspirin-acetaminophen-caffeine (EXCEDRIN MIGRAINE) [phone] MG per tablet Take 1-2 tablets by mouth daily as needed for headache.     Historical Provider, MD  cephALEXin (KEFLEX) 500 MG capsule Take 1 capsule (500 mg total) by mouth 4 (four) times daily. 07/08/14   Burgess Amor, PA-C  doxycycline (VIBRAMYCIN) 100 MG capsule Take 1 capsule (100 mg total) by mouth 2 (two) times daily. 07/08/14   Burgess Amor, PA-C  HYDROcodone-acetaminophen (NORCO/VICODIN) 5-325 MG tablet Take 2 tablets by mouth every 4 (four) hours as needed. 04/19/15   Barbara Cower Elly Haffey, MD   BP 168/90 mmHg  Pulse 89  Temp(Src) 98 F (36.7 C) (Oral)  Resp 16  Ht  (1.702 m)  Wt 180 lb (81.647 kg)  BMI 28.19 kg/m2  SpO2 100% Physical Exam  Constitutional: He is oriented to person, place, and time. He appears well-developed and well-nourished.  HENT:  Head: Normocephalic and atraumatic.  Neck: Normal range of motion.  Cardiovascular: Normal rate.   Normal radial pulse right hand  Pulmonary/Chest: Effort normal. No respiratory distress.  Abdominal: Soft. He exhibits no distension. There is no tenderness. There is no rebound.  Musculoskeletal: Normal range of motion. He exhibits edema (right hand) and tenderness (right metacarpals with AP and lateral compression, significant swelling).  Decreased ROM of all interphalangeal joints of right hand 2/2 pain but when isolated can move at all joints Normal sensation in all fingers.   Neurological: He is alert and oriented to person, place, and time. No cranial nerve deficit.  Skin: Skin is warm and dry.  Nursing note and vitals reviewed.   ED Course  Procedures (including critical care time) Labs Review Labs Reviewed - No data to display  Imaging Review Dg Hand Complete Right  04/19/2015  CLINICAL DATA:  Crush injury to the right hand EXAM: RIGHT HAND - COMPLETE 3+ VIEW COMPARISON:  None. FINDINGS: There is dorsal right hand soft tissue swelling at the level of the distal metacarpals. No fracture, dislocation, focal osseous lesion or appreciable arthropathy. No pathologic soft tissue densities. IMPRESSION: Dorsal  right hand soft tissue swelling, with no fracture or malalignment . Electronically Signed   By: Delbert PhenixJason A Poff M.D.   On: 04/19/2015 17:17   I have personally reviewed and evaluated these images and lab results as part of my medical decision-making.   EKG Interpretation None      MDM   Final diagnoses:  Pain of right hand    Negative xr but due to amoutn of pain and swelling, patient splinted anyway and will follow up for repeat xr's in a week.   New Prescriptions: Discharge Medication List as of 04/19/2015  6:10 PM      I have personally and contemperaneously reviewed labs and imaging and used in my decision making as above.   A medical screening exam was performed and I feel the patient has had an appropriate workup for their chief complaint at this time and likelihood of emergent condition existing is low. Their vital signs are stable. They have been counseled on decision, discharge, follow up and which symptoms necessitate immediate return to the emergency department.  They verbally stated understanding and agreement with plan and discharged in stable condition.      Marily MemosJason Ganon Demasi, MD 04/19/15 2107

## 2015-04-19 NOTE — ED Notes (Signed)
Pt presents to ED with complaints of right hand pain.  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  Swelling is noted, cap refill is normal.

## 2015-04-24 ENCOUNTER — Emergency Department (HOSPITAL_COMMUNITY)
Admission: EM | Admit: 2015-04-24 | Discharge: 2015-04-24 | Disposition: A | Discharge status: Home / Self Care | Payer: Self-pay | Attending: Dermatology | Admitting: Dermatology

## 2015-04-24 ENCOUNTER — Encounter (HOSPITAL_COMMUNITY): Payer: Self-pay | Admitting: Emergency Medicine

## 2015-04-24 DIAGNOSIS — M79641 Pain in right hand: Secondary | ICD-10-CM | POA: Insufficient documentation

## 2015-04-24 DIAGNOSIS — F17210 Nicotine dependence, cigarettes, uncomplicated: Secondary | ICD-10-CM | POA: Insufficient documentation

## 2015-04-24 DIAGNOSIS — Z5321 Procedure and treatment not carried out due to patient leaving prior to being seen by health care provider: Secondary | ICD-10-CM | POA: Insufficient documentation

## 2015-04-24 NOTE — ED Notes (Signed)
Here Sunday for right hand injury, here for recheck.  Rates pain now at 7/10.

## 2015-04-24 NOTE — ED Notes (Signed)
Pt left AMA at this time, citing wait time as rationale

## 2015-08-29 ENCOUNTER — Emergency Department (HOSPITAL_COMMUNITY)
Admission: EM | Admit: 2015-08-29 | Discharge: 2015-08-30 | Disposition: A | Discharge status: Home / Self Care | Payer: Self-pay | Attending: Emergency Medicine | Admitting: Emergency Medicine

## 2015-08-29 ENCOUNTER — Encounter (HOSPITAL_COMMUNITY): Payer: Self-pay | Admitting: *Deleted

## 2015-08-29 DIAGNOSIS — Z79899 Other long term (current) drug therapy: Secondary | ICD-10-CM | POA: Insufficient documentation

## 2015-08-29 DIAGNOSIS — L02412 Cutaneous abscess of left axilla: Secondary | ICD-10-CM | POA: Insufficient documentation

## 2015-08-29 DIAGNOSIS — F17210 Nicotine dependence, cigarettes, uncomplicated: Secondary | ICD-10-CM | POA: Insufficient documentation

## 2015-08-29 DIAGNOSIS — Z792 Long term (current) use of antibiotics: Secondary | ICD-10-CM | POA: Insufficient documentation

## 2015-08-29 DIAGNOSIS — R110 Nausea: Secondary | ICD-10-CM | POA: Insufficient documentation

## 2015-08-29 DIAGNOSIS — L0291 Cutaneous abscess, unspecified: Secondary | ICD-10-CM
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
## 2015-08-30 MED ORDER — HYDROCODONE-ACETAMINOPHEN 5-325 MG PO TABS
2.0000 | ORAL_TABLET | Freq: Once | ORAL | Status: AC
  Administered 2015-08-30: 2 via ORAL
  Filled 2015-08-30: qty 2

## 2015-08-30 MED ORDER — HYDROCODONE-ACETAMINOPHEN 5-325 MG PO TABS: 1.0000 | ORAL_TABLET | ORAL | 0 refills | Status: AC | PRN

## 2015-08-30 MED ORDER — DOXYCYCLINE HYCLATE 100 MG PO TABS
100.0000 mg | ORAL_TABLET | Freq: Once | ORAL | Status: AC
  Administered 2015-08-30: 100 mg via ORAL
  Filled 2015-08-30: qty 1

## 2015-08-30 MED ORDER — SULFAMETHOXAZOLE-TRIMETHOPRIM 800-160 MG PO TABS: 1.0000 | ORAL_TABLET | Freq: Two times a day (BID) | ORAL | 0 refills | Status: AC

## 2015-08-30 MED ORDER — LIDOCAINE HCL (PF) 2 % IJ SOLN
10.0000 mL | Freq: Once | INTRAMUSCULAR | Status: AC
  Administered 2015-08-30: 10 mL
  Filled 2015-08-30: qty 10

## 2015-08-30 MED ORDER — PROMETHAZINE HCL 12.5 MG PO TABS
12.5000 mg | ORAL_TABLET | Freq: Once | ORAL | Status: AC
  Administered 2015-08-30: 12.5 mg via ORAL
  Filled 2015-08-30: qty 1

## 2015-08-30 NOTE — ED Provider Notes (Signed)
[facility]-EMER[facility]ENCY DEPT Provider Note   CSN: [id number] Arrival date & time: 08/29/15  2122     History   Chief Complaint Chief Complaint  Patient presents with  . Abscess    HPI Cameron Kirk is a 40 y.o. male.  The history is provided by the patient.  Abscess  Location:  Shoulder/arm Shoulder/arm abscess location:  L axilla Abscess quality: painful, redness and warmth   Abscess quality: not draining   Red streaking: no   Progression:  Worsening Pain details:    Quality:  Aching and shooting   Severity:  Moderate   Timing:  Intermittent   Progression:  Worsening Chronicity:  New Context: not diabetes and not immunosuppression   Relieved by:  Nothing Worsened by:  Nothing (palpation and certain movement) Ineffective treatments:  None tried Associated symptoms: nausea   Associated symptoms: no fever   Risk factors: no hx of MRSA     History reviewed. No pertinent past medical history.  There are no active problems to display for this patient.   Past Surgical History:  Procedure Laterality Date  . BRAIN SURGERY    . HERNIA REPAIR    . KNEE SURGERY         Home Medications    Prior to Admission medications   Medication Sig Start Date End Date Taking? Authorizing Provider  aspirin-acetaminophen-caffeine (EXCEDRIN MIGRAINE) [phone] MG per tablet Take 1-2 tablets by mouth daily as needed for headache.    Historical Provider, MD  cephALEXin (KEFLEX) 500 MG capsule Take 1 capsule (500 mg total) by mouth 4 (four) times daily. 07/08/14   Burgess AmorJulie Idol, PA-C  doxycycline (VIBRAMYCIN) 100 MG capsule Take 1 capsule (100 mg total) by mouth 2 (two) times daily. 07/08/14   Burgess AmorJulie Idol, PA-C  HYDROcodone-acetaminophen (NORCO/VICODIN) 5-325 MG tablet Take 2 tablets by mouth every 4 (four) hours as needed. 04/19/15   Marily MemosJason Mesner, MD    Family History No family history on file.  Social History Social History  Substance Use Topics  . Smoking status: Current Every Day  Smoker    Packs/day: 0.50    Types: Cigarettes  . Smokeless tobacco: Never Used  . Alcohol use No     Allergies   Ibuprofen; Penicillins; and Tramadol   Review of Systems Review of Systems  Constitutional: Negative for fever.  Gastrointestinal: Positive for nausea.  Skin:       Abscess axilla  All other systems reviewed and are negative.    Physical Exam Updated Vital Signs BP (!) 132/105 (BP Location: Right Arm)   Pulse 93   Temp 98.2 F (36.8 C) (Oral)   Resp 16   Ht 5\' 7"  (1.702 m)   Wt 83.9 kg   SpO2 97%   BMI 28.98 kg/m   Physical Exam  Constitutional: He is oriented to person, place, and time. He appears well-developed and well-nourished.  Non-toxic appearance.  HENT:  Head: Normocephalic.  Right Ear: Tympanic membrane and external ear normal.  Left Ear: Tympanic membrane and external ear normal.  Eyes: EOM and lids are normal. Pupils are equal, round, and reactive to light.  Neck: Normal range of motion. Neck supple. Carotid bruit is not present.  Cardiovascular: Normal rate, regular rhythm, normal heart sounds, intact distal pulses and normal pulses.   Pulmonary/Chest: Breath sounds normal. No respiratory distress.  Abdominal: Soft. Bowel sounds are normal. There is no tenderness. There is no guarding.  Musculoskeletal: Normal range of motion.  Abscess left axilla. No drainage. No  red streaking. Painful to palpation.  Lymphadenopathy:       Head (right side): No submandibular adenopathy present.       Head (left side): No submandibular adenopathy present.    He has no cervical adenopathy.  Neurological: He is alert and oriented to person, place, and time. He has normal strength. No cranial nerve deficit or sensory deficit.  Skin: Skin is warm and dry.  Psychiatric: He has a normal mood and affect. His speech is normal.  Nursing note and vitals reviewed.    ED Treatments / Results  Labs (all labs ordered are listed, but only abnormal results are  displayed) Labs Reviewed  AEROBIC CULTURE (SUPERFICIAL SPECIMEN)    EKG  EKG Interpretation None       Radiology No results found.  Procedures .Marland KitchenIncision and Drainage Date/Time: 08/30/2015 1:39 AM Performed by: Ivery Quale Authorized by: Ivery Quale   Consent:    Consent obtained:  Verbal   Consent given by:  Patient   Risks discussed:  Bleeding and pain   Alternatives discussed:  Referral Location:    Type:  Abscess   Location:  Upper extremity   Upper extremity location: left axilla. Pre-procedure details:    Skin preparation:  Antiseptic wash Anesthesia (see MAR for exact dosages):    Anesthesia method:  Local infiltration   Local anesthetic:  Lidocaine 2% w/o epi Procedure type:    Complexity:  Simple Procedure details:    Incision types:  Single straight   Incision depth:  Subcutaneous   Scalpel blade:  11   Wound management:  Probed and deloculated   Drainage:  Bloody   Drainage amount:  Moderate   Wound treatment:  Wound left open Post-procedure details:    Patient tolerance of procedure:  Tolerated well, no immediate complications   (including critical care time)  Medications Ordered in ED Medications  lidocaine (XYLOCAINE) 2 % injection 10 mL (10 mLs Other Given 08/30/15 0057)  doxycycline (VIBRA-TABS) tablet 100 mg (100 mg Oral Given 08/30/15 0057)     Initial Impression / Assessment and Plan / ED Course  I have reviewed the triage vital signs and the nursing notes.  Pertinent labs & imaging results that were available during my care of the patient were reviewed by me and considered in my medical decision making (see chart for details).  Clinical Course    *I have reviewed nursing notes, vital signs, and all appropriate lab and imaging results for this patient.**  Final Clinical Impressions(s) / ED Diagnoses Incision and drainage carried out for left axilla abscess. Culture sent to the lab. Patient started on Bactrim and Norco. Advised  patient to use warm Epsom salt soaks, and to see his primary physician or return to the emergency department if any changes, problems, or signs of advancing infection. Patient is in agreement with the discharge plan.    Final diagnoses:  Abscess    New Prescriptions New Prescriptions   No medications on file     Ivery Quale, PA-C 08/31/15 1231    Devoria Albe, MD 09/11/15 [phone]

## 2015-08-30 NOTE — ED Notes (Signed)
Spoke with Link SnufferHobson pa re: statements pt made at triage about rope.  Link SnufferHobson states pt denies self harm and is clear to be discharged home.  Pt denies si/hi to Clinical research associatewriter at this time.

## 2015-08-30 NOTE — Discharge Instructions (Signed)
Please soak the left under arm in warm Epsom Salt water daily for 15 min. Starting Monday until the area is healed. Use bactrim daily. Use norco for pain not improved by ibuprofen.

## 2017-09-06 IMAGING — DX DG HAND COMPLETE 3+V*R*
3 series · 3 of 3 positions shown · non-contrast
Comparison: None.

CLINICAL DATA: Crush injury to the right hand

EXAM:
RIGHT HAND - COMPLETE 3+ VIEW

[hand pa]
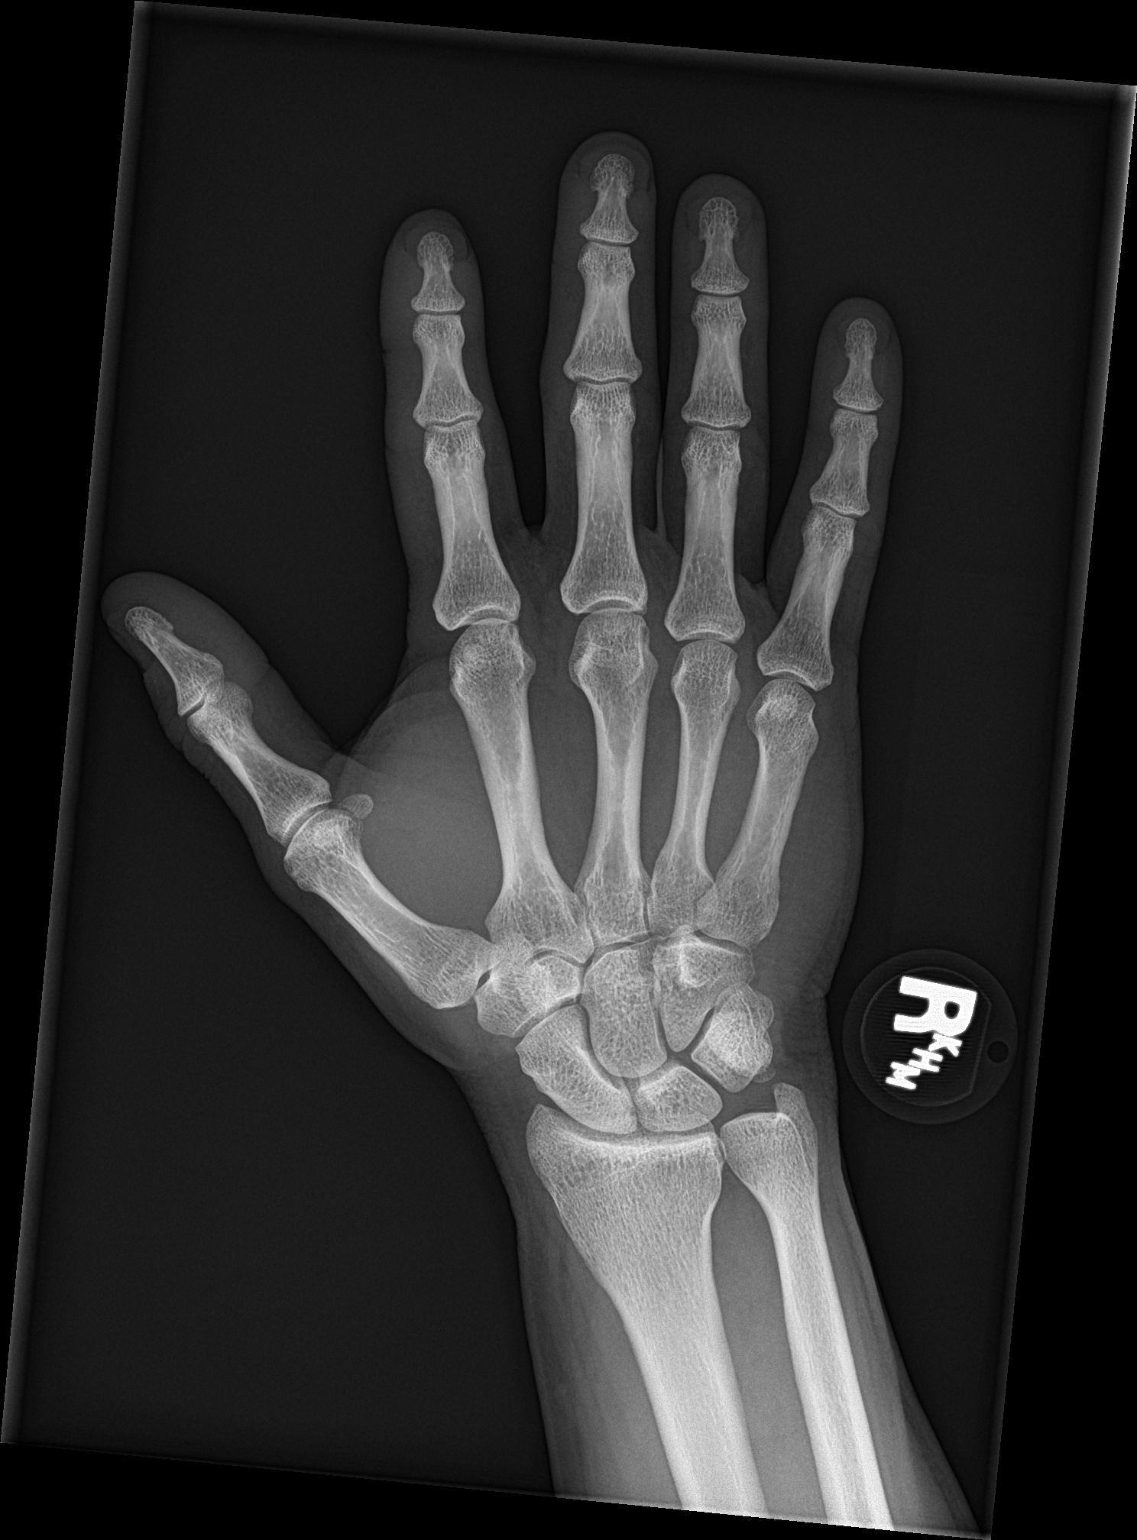

[hand obl]
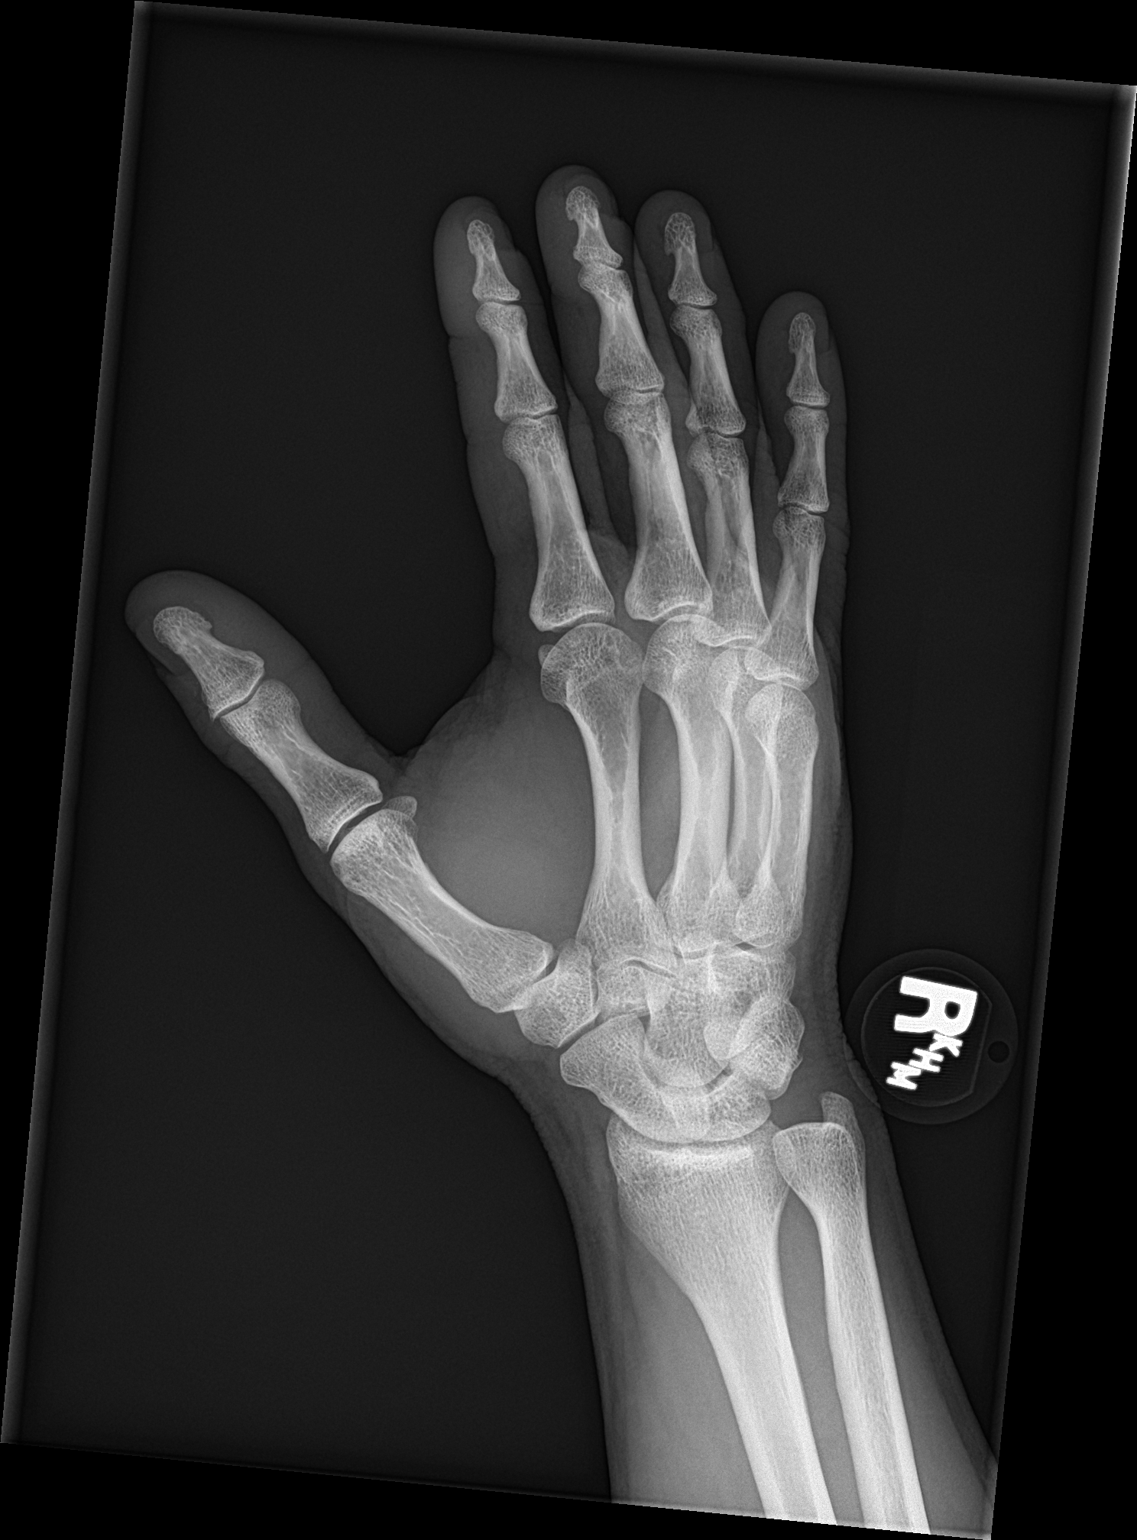

[hand lat]
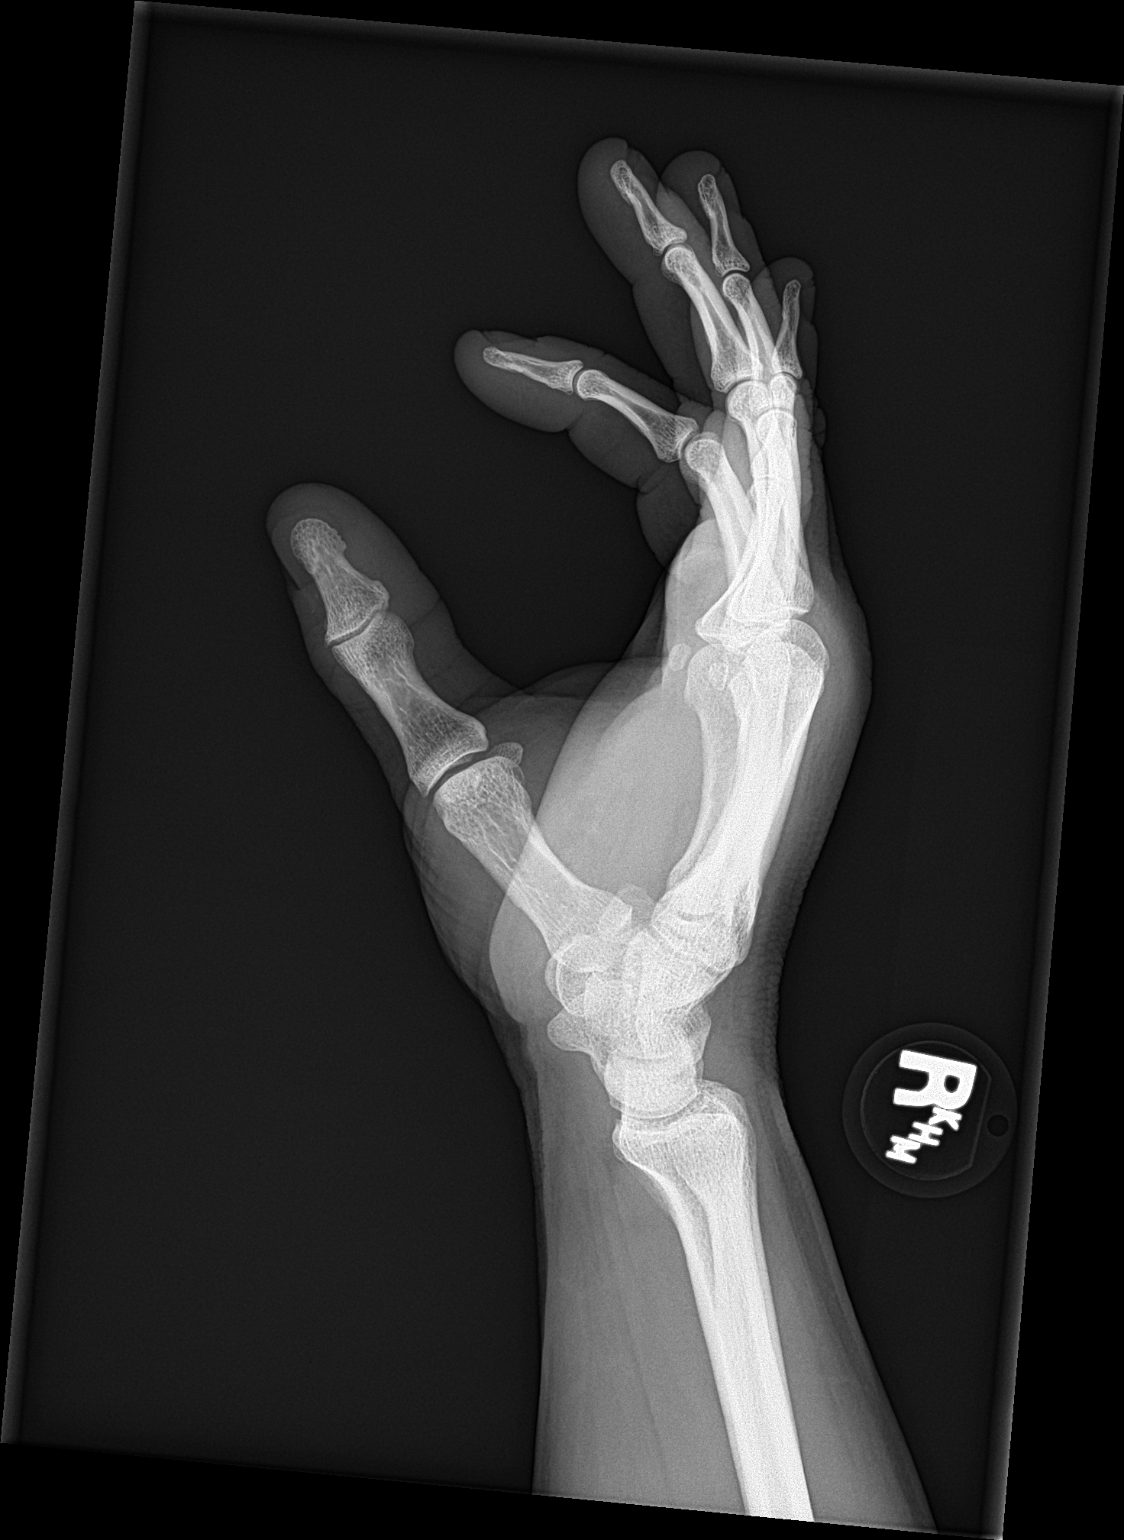

[3 of 3 positions shown; findings below may reference images not displayed]

FINDINGS: There is dorsal right hand soft tissue swelling at the level of the
distal metacarpals. No fracture, dislocation, focal osseous lesion
or appreciable arthropathy. No pathologic soft tissue densities.
IMPRESSION: Dorsal right hand soft tissue swelling, with no fracture or
malalignment .
